# Patient Record
Sex: Male | Born: 1970 | Race: Black or African American | Hispanic: No | Marital: Single | Smoking: Current every day smoker
Health system: Southern US, Community
[De-identification: ages and names within clinical notes are randomized; demographics above are authoritative.]

---

## 2007-08-07 ENCOUNTER — Emergency Department (HOSPITAL_COMMUNITY): Admission: EM | Admit: 2007-08-07 | Discharge: 2007-08-07 | Payer: Self-pay | Admitting: Emergency Medicine

## 2011-05-28 ENCOUNTER — Emergency Department (HOSPITAL_COMMUNITY)
Admission: EM | Admit: 2011-05-28 | Discharge: 2011-05-29 | Disposition: A | Discharge status: Home / Self Care | Payer: BC Managed Care – PPO | Attending: Emergency Medicine | Admitting: Emergency Medicine

## 2011-05-28 DIAGNOSIS — R0789 Other chest pain: Secondary | ICD-10-CM

## 2011-05-28 DIAGNOSIS — R0602 Shortness of breath: Secondary | ICD-10-CM | POA: Insufficient documentation

## 2011-05-28 DIAGNOSIS — R079 Chest pain, unspecified: Secondary | ICD-10-CM | POA: Insufficient documentation

## 2011-05-28 DIAGNOSIS — J189 Pneumonia, unspecified organism: Secondary | ICD-10-CM

## 2011-05-28 DIAGNOSIS — R799 Abnormal finding of blood chemistry, unspecified: Secondary | ICD-10-CM | POA: Insufficient documentation

## 2011-05-28 DIAGNOSIS — R071 Chest pain on breathing: Secondary | ICD-10-CM | POA: Insufficient documentation

## 2011-05-29 ENCOUNTER — Emergency Department (HOSPITAL_COMMUNITY): Payer: BC Managed Care – PPO

## 2011-05-29 ENCOUNTER — Encounter (HOSPITAL_COMMUNITY): Payer: Self-pay | Admitting: Emergency Medicine

## 2011-05-29 ENCOUNTER — Other Ambulatory Visit: Payer: Self-pay

## 2011-05-29 LAB — CBC
Platelets: 219 10*3/uL (ref 150–400)
RBC: 4.85 MIL/uL (ref 4.22–5.81)
RDW: 13.8 % (ref 11.5–15.5)
WBC: 8.5 10*3/uL (ref 4.0–10.5)

## 2011-05-29 LAB — PROTIME-INR
INR: 0.97 (ref 0.00–1.49)
Prothrombin Time: 13.1 seconds (ref 11.6–15.2)

## 2011-05-29 LAB — DIFFERENTIAL
Basophils Absolute: 0.1 10*3/uL (ref 0.0–0.1)
Eosinophils Relative: 12 % — ABNORMAL HIGH (ref 0–5)
Lymphocytes Relative: 35 % (ref 12–46)
Lymphs Abs: 2.9 10*3/uL (ref 0.7–4.0)
Neutrophils Relative %: 43 % (ref 43–77)

## 2011-05-29 LAB — COMPREHENSIVE METABOLIC PANEL
ALT: 19 U/L (ref 0–53)
AST: 18 U/L (ref 0–37)
Alkaline Phosphatase: 79 U/L (ref 39–117)
CO2: 25 mEq/L (ref 19–32)
Calcium: 9.3 mg/dL (ref 8.4–10.5)
GFR calc Af Amer: >90 mL/min (ref >90)
GFR calc non Af Amer: >90 mL/min (ref >90)
Glucose, Bld: 108 mg/dL — ABNORMAL HIGH (ref 70–99)
Potassium: 3.4 mEq/L — ABNORMAL LOW (ref 3.5–5.1)
Sodium: 138 mEq/L (ref 135–145)
Total Protein: 7.3 g/dL (ref 6.0–8.3)

## 2011-05-29 LAB — POCT I-STAT TROPONIN I

## 2011-05-29 LAB — PRO B NATRIURETIC PEPTIDE: Pro B Natriuretic peptide (BNP): 8.5 pg/mL (ref 0–125)

## 2011-05-29 MED ORDER — KETOROLAC TROMETHAMINE 30 MG/ML IJ SOLN
30.0000 mg | Freq: Once | INTRAMUSCULAR | Status: AC
  Administered 2011-05-29: 30 mg via INTRAVENOUS
  Filled 2011-05-29: qty 1

## 2011-05-29 MED ORDER — MOXIFLOXACIN HCL 400 MG PO TABS: 400.0000 mg | ORAL_TABLET | Freq: Every day | ORAL | Status: AC

## 2011-05-29 MED ORDER — AZITHROMYCIN 250 MG PO TABS
500.0000 mg | ORAL_TABLET | Freq: Once | ORAL | Status: AC
  Administered 2011-05-29: 500 mg via ORAL
  Filled 2011-05-29: qty 2

## 2011-05-29 MED ORDER — IBUPROFEN 600 MG PO TABS: 600.0000 mg | ORAL_TABLET | Freq: Four times a day (QID) | ORAL | Status: AC | PRN

## 2011-05-29 MED ORDER — CYCLOBENZAPRINE HCL 10 MG PO TABS
10.0000 mg | ORAL_TABLET | Freq: Three times a day (TID) | ORAL | Status: DC | PRN
  Administered 2011-05-29: 10 mg via ORAL
  Filled 2011-05-29: qty 1

## 2011-05-29 MED ORDER — IOHEXOL 300 MG/ML  SOLN
100.0000 mL | Freq: Once | INTRAMUSCULAR | Status: AC | PRN
  Administered 2011-05-29: 100 mL via INTRAVENOUS

## 2011-05-29 MED ORDER — CYCLOBENZAPRINE HCL 10 MG PO TABS: 10.0000 mg | ORAL_TABLET | Freq: Three times a day (TID) | ORAL | Status: AC | PRN

## 2011-05-29 MED ORDER — SODIUM CHLORIDE 0.9 % IV SOLN
INTRAVENOUS | Status: DC
  Administered 2011-05-29: 02:00:00 via INTRAVENOUS

## 2011-05-29 MED ORDER — DEXTROSE 5 % IV SOLN
1.0000 g | Freq: Once | INTRAVENOUS | Status: AC
  Administered 2011-05-29: 1 g via INTRAVENOUS
  Filled 2011-05-29: qty 10

## 2011-05-29 MED ORDER — DM-GUAIFENESIN ER 30-600 MG PO TB12: 1.0000 | ORAL_TABLET | Freq: Two times a day (BID) | ORAL | Status: AC

## 2011-05-29 NOTE — Discharge Instructions (Signed)
You have a pneumonia on the CT scan, your pneumonia wasn't visible on your chest xray. Your blood tests do not show evidence of a heart attack or fluid in your lungs. Take the antibiotic until gone. You got the first dose today in the ED, you can start the prescription antibiotic tomorrow. I also gave you medication for your cough and also for your chest wall pain from the pneumonia. You should be rechecked in about 2 days, you can go to the [facility] for that. If you feel worse, such as feeling like you are struggling to breathe, you should come back to be rechecked.

## 2011-05-29 NOTE — ED Notes (Signed)
Pt c/o of intermitted chest pain for three days. Pain is worse when pt takes a deep breath

## 2011-05-29 NOTE — ED Notes (Signed)
MD at bedside. 

## 2011-05-29 NOTE — ED Provider Notes (Cosign Needed)
History     CSN: [id number]  Arrival date & time 05/28/11  2329   First MD Initiated Contact with Patient 05/28/11 2351      Chief Complaint  Patient presents with  . Shortness of Breath    pain with deep Breaths  . Chest Pain    (Consider location/radiation/quality/duration/timing/severity/associated sxs/prior treatment) HPI  Patient relates about 3 days ago while driving he started getting a left sided chest pain that was intermittent for hours but has been constant now since last night. He indicates that it is in his left lateral chest and swings around into the left mid axilla. He states the pain is sharp and burning in character. He states it hurts with deep breathing and he also feels short of breath. He denies cough, fever, nausea, vomiting. He states he had rhinorrhea earlier in the week without fever. He states he's never had this pain before. He denies any change in activity. He denies any prolonged sitting or driving.  PCP none  History reviewed. No pertinent past medical history.  History reviewed. No pertinent past surgical history.  Family History  Problem Relation Age of Onset  . Diabetes Mother   No CAD  History  Substance Use Topics  . Smoking status: Current Everyday Smoker -- 7 years    Types: Cigars  . Smokeless tobacco: Never Used  . 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    Allergies  Shellfish allergy  Home Medications  none   BP 148/84  Pulse 68  Temp(Src) 98.4 F (36.9 C) (Oral)  Resp 18  Ht 6' (1.829 m)  Wt 205 lb (92.987 kg)  BMI 27.80 kg/m2  SpO2 100%  Vital signs normal    Physical Exam  Nursing note and vitals reviewed. Constitutional: He is oriented to person, place, and time. He appears well-developed and well-nourished.  Non-toxic appearance. He does not appear ill. No distress.  HENT:  Head: Normocephalic and atraumatic.  Right Ear: External ear  normal.  Left Ear: External ear normal.  Nose: Nose normal. No mucosal edema or rhinorrhea.  Mouth/Throat: Oropharynx is clear and moist and mucous membranes are normal. No dental abscesses or uvula swelling.  Eyes: Conjunctivae and EOM are normal. Pupils are equal, round, and reactive to light.  Neck: Normal range of motion and full passive range of motion without pain. Neck supple.  Cardiovascular: Normal rate, regular rhythm and normal heart sounds.  Exam reveals no gallop and no friction rub.   No murmur heard. Pulmonary/Chest: No respiratory distress. He has no wheezes. He has no rhonchi. He has no rales. He exhibits tenderness. He exhibits no crepitus.         Patient has mild tenderness in his left chest however he states he has more pain that appears to be deeper than I can touch. He also is unable to take deep breaths because of pain. He also seems painful when he tried to change positions. Patient's noted to take shallow breaths  Abdominal: Soft. Normal appearance and bowel sounds are normal. He exhibits no distension. There is no tenderness. There is no rebound and no guarding.  Musculoskeletal: Normal range of motion. He exhibits no edema and no tenderness.       Moves all extremities well.   Neurological: He is alert and oriented to person, place, and time. He has normal strength. No cranial nerve deficit.  Skin: Skin is  warm, dry and intact. No rash noted. No erythema. No pallor.  Psychiatric: His speech is normal and behavior is normal. His mood appears not anxious.       Very flat affect    ED Course  Procedures (including critical care time)  Patient was treated for what was felt to be chest wall pain. His d-dimer came back elevated and a CT imaging of the chest was done to rule out PE at which point he had onsolidation in his left lower lung and a little bit in the right  that the radiologist felt was edema and not an infiltrate such as pneumonia. A BNP was added to his labs  and came back nonsignificant. Patient was treated for his pneumonia.    patient was ambulated nursing staff and his pulse ox remained 98%. It was felt he could be discharged.   Medications  0.9 %  sodium chloride infusion (  Intravenous New Bag/Given 05/29/11 0141)  cyclobenzaprine (FLEXERIL) tablet 10 mg (10 mg Oral Given 05/29/11 0132)  ketorolac (TORADOL) 30 MG/ML injection 30 mg (30 mg Intravenous Given 05/29/11 0141)  iohexol (OMNIPAQUE) 300 MG/ML solution 100 mL (100 mL Intravenous Contrast Given 05/29/11 0307)  cefTRIAXone (ROCEPHIN) 1 g in dextrose 5 % 50 mL IVPB (1 g Intravenous Given 05/29/11 0645)  azithromycin (ZITHROMAX) tablet 500 mg (500 mg Oral Given 05/29/11 0644)   Pt states his pain is better.    Results for orders placed during the hospital encounter of 05/28/11  CBC      Component Value Range   WBC 8.5  4.0 - 10.5 (K/uL)   RBC 4.85  4.22 - 5.81 (MIL/uL)   Hemoglobin 14.4  13.0 - 17.0 (g/dL)   HCT 11.9  14.7 - 82.9 (%)   MCV 85.6  78.0 - 100.0 (fL)   MCH 29.7  26.0 - 34.0 (pg)   MCHC 34.7  30.0 - 36.0 (g/dL)   RDW 56.2  13.0 - 86.5 (%)   Platelets 219  150 - 400 (K/uL)  DIFFERENTIAL      Component Value Range   Neutrophils Relative 43  43 - 77 (%)   Neutro Abs 3.7  1.7 - 7.7 (K/uL)   Lymphocytes Relative 35  12 - 46 (%)   Lymphs Abs 2.9  0.7 - 4.0 (K/uL)   Monocytes Relative 10  3 - 12 (%)   Monocytes Absolute 0.8  0.1 - 1.0 (K/uL)   Eosinophils Relative 12 (*) 0 - 5 (%)   Eosinophils Absolute 1.0 (*) 0.0 - 0.7 (K/uL)   Basophils Relative 1  0 - 1 (%)   Basophils Absolute 0.1  0.0 - 0.1 (K/uL)  COMPREHENSIVE METABOLIC PANEL      Component Value Range   Sodium 138  135 - 145 (mEq/L)   Potassium 3.4 (*) 3.5 - 5.1 (mEq/L)   Chloride 104  96 - 112 (mEq/L)   CO2 25  19 - 32 (mEq/L)   Glucose, Bld 108 (*) 70 - 99 (mg/dL)   BUN 10  6 - 23 (mg/dL)   Creatinine, Ser 7.84  0.50 - 1.35 (mg/dL)   Calcium 9.3  8.4 - 69.6 (mg/dL)   Total Protein 7.3  6.0 - 8.3 (g/dL)    Albumin 3.7  3.5 - 5.2 (g/dL)   AST 18  0 - 37 (U/L)   ALT 19  0 - 53 (U/L)   Alkaline Phosphatase 79  39 - 117 (U/L)   Total Bilirubin 0.2 (*) 0.3 -  1.2 (mg/dL)   GFR calc non Af Amer >90  >90 (mL/min)   GFR calc Af Amer >90  >90 (mL/min)  D-DIMER, QUANTITATIVE      Component Value Range   D-Dimer, Quant 3.60 (*) 0.00 - 0.48 (ug/mL-FEU)  APTT      Component Value Range   aPTT 39 (*) 24 - 37 (seconds)  PROTIME-INR      Component Value Range   Prothrombin Time 13.1  11.6 - 15.2 (seconds)   INR 0.97  0.00 - 1.49   POCT I-STAT TROPONIN I      Component Value Range   Troponin i, poc 0.00  0.00 - 0.08 (ng/mL)   Comment 3           PRO B NATRIURETIC PEPTIDE      Component Value Range   Pro B Natriuretic peptide (BNP) 8.5  0 - 125 (pg/mL)   Laboratory interpretation all normal except elevated d-dimer   Dg Chest 2 View  05/29/2011  *RADIOLOGY REPORT*  Clinical Data: Left-sided chest pain and tightness; shortness of breath for 3 days.  History of smoking.  CHEST - 2 VIEW  Comparison: None.  Findings: The lungs are well-aerated.  Likely chronic peribronchial thickening is noted.  Blunting of the left costophrenic angle likely reflects a small left pleural effusion, though pleural thickening could have a similar appearance.  Mild left basilar airspace opacity likely reflects atelectasis.  There is no evidence of pneumothorax.  The heart is normal in size; the mediastinal contour is within normal limits.  No acute osseous abnormalities are seen.  IMPRESSION: Likely chronic peribronchial thickening and suspected small left pleural effusion.  Mild left basilar opacity likely reflects atelectasis.  Original Report Authenticated By: Tonia Ghent, M.D.   Ct Angio Chest W/cm &/or Wo Cm  05/29/2011  *RADIOLOGY REPORT*  Clinical Data: Pleuritic left-sided chest pain and shortness of breath; elevated D-dimer.  CT ANGIOGRAPHY CHEST  Technique:  Multidetector CT imaging of the chest using the standard  protocol during bolus administration of intravenous contrast. Multiplanar reconstructed images including MIPs were obtained and reviewed to evaluate the vascular anatomy.  Contrast: OMNIPAQUE IOHEXOL 300 MG/ML IJ SOLN  Comparison: Chest radiograph performed earlier today at 01:19 a.m.  Findings: There is no evidence of pulmonary embolus.  Patchy airspace opacification is noted at the left lung base; mild ground-glass opacity is suggested within the right lower lobe.  A small left pleural effusion is seen.  Findings raise suspicion for asymmetric pulmonary edema, left greater than right.  Scattered tiny nodular densities are seen within both lungs, measuring less than 4 mm in size.  These are likely post infectious in nature, given the patient's age and the multiplicity of nodules. Minimal blebs are seen at the lung apices.  There is no evidence of pneumothorax.  No abnormal focal contrast enhancement is seen.  The mediastinum is unremarkable in appearance.  No mediastinal lymphadenopathy is seen.  No pericardial effusion is identified. The great vessels are unremarkable in appearance.  No axillary lymphadenopathy is seen.  The thyroid gland is unremarkable in appearance.  A small 1.0 cm hypodensity is noted within the liver, likely reflecting a small cyst; the visualized portions of the liver and spleen are otherwise unremarkable.  No acute osseous abnormalities are seen.  IMPRESSION:  1.  No evidence of pulmonary embolus. 2.  Patchy airspace opacification at the left lung base; mild ground-glass opacity within the right lower lobe.  Small left pleural effusion seen.  Findings raise suspicion for asymmetric pulmonary edema, left greater than right.  The appearance is atypical for pneumonia. 3.  Scattered tiny nodular density within both lungs are most likely post infectious in nature. 4.  Minimal blebs at the lung apices. 5.  Likely small hepatic cyst.  Original Report Authenticated By: Tonia Ghent, M.D.        Date: 05/29/2011  Rate: 73  Rhythm: normal sinus rhythm  QRS Axis: normal  Intervals: normal  ST/T Wave abnormalities: normal  Conduction Disutrbances:LVH  Narrative Interpretation:   Old EKG Reviewed: none available    1. Left-sided chest wall pain   2. Community acquired pneumonia    New Prescriptions   CYCLOBENZAPRINE (FLEXERIL) 10 MG TABLET    Take 1 tablet (10 mg total) by mouth 3 (three) times daily as needed for muscle spasms.   DEXTROMETHORPHAN-GUAIFENESIN (MUCINEX DM) 30-600 MG PER 12 HR TABLET    Take 1 tablet by mouth every 12 (twelve) hours.   IBUPROFEN (ADVIL,MOTRIN) 600 MG TABLET    Take 1 tablet (600 mg total) by mouth every 6 (six) hours as needed for pain.   MOXIFLOXACIN (AVELOX) 400 MG TABLET    Take 1 tablet (400 mg total) by mouth daily.   Plan discharge  Devoria Albe, MD, FACEP    MDM          Ward Givens, MD 05/29/11 [phone]

## 2011-05-29 NOTE — ED Notes (Signed)
Patient transported to X-ray and returned 

## 2011-05-29 NOTE — ED Notes (Signed)
Patient transported to CT 

## 2013-09-16 IMAGING — CT CT ANGIO CHEST
1 of 2 series · 19 of 32 positions shown · IV contrast (APPLIED)
Comparison: Chest radiograph performed earlier today at [DATE] a.m.

CLINICAL DATA: Pleuritic left-sided chest pain and shortness of
breath; elevated D-dimer.

CT ANGIOGRAPHY CHEST
TECHNIQUE: Multidetector CT imaging of the chest using the
standard protocol during bolus administration of intravenous
contrast. Multiplanar reconstructed images including MIPs were
obtained and reviewed to evaluate the vascular anatomy.
Contrast: 100mL OMNIPAQUE IOHEXOL 300 MG/ML IJ SOLN

[Series 5: thins for pacs · axial · 0.68mm/px · z∈[-295,-45]mm · 19 of 274 slices shown]
[im 12/274  lung]
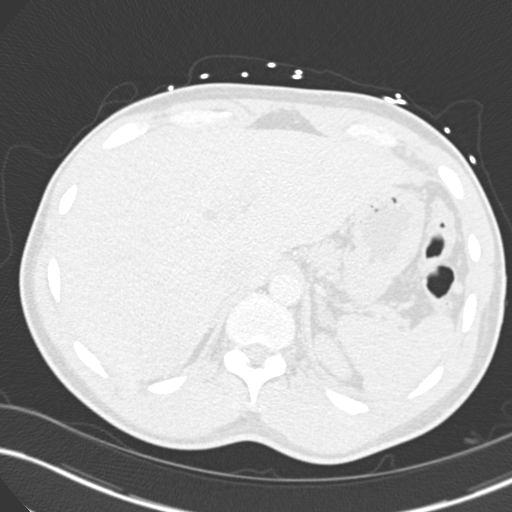
[im 24/274  soft-tissue]
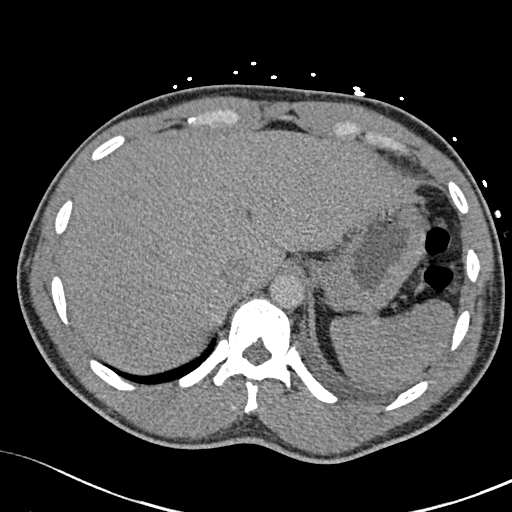
[im 36/274  lung]
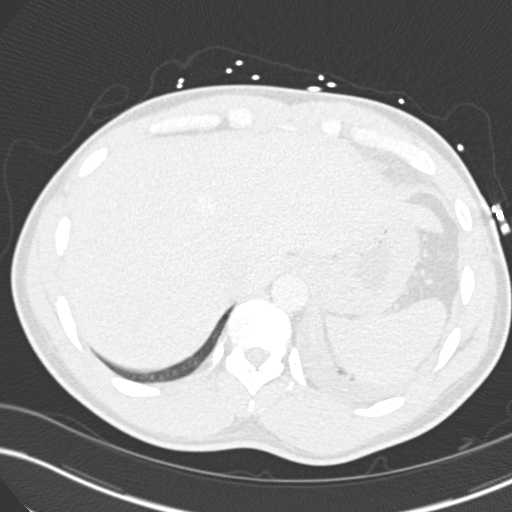
[im 60/274  soft-tissue]
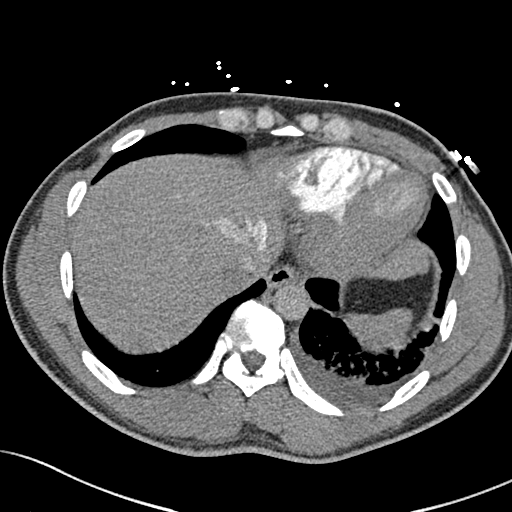
[im 72/274  lung]
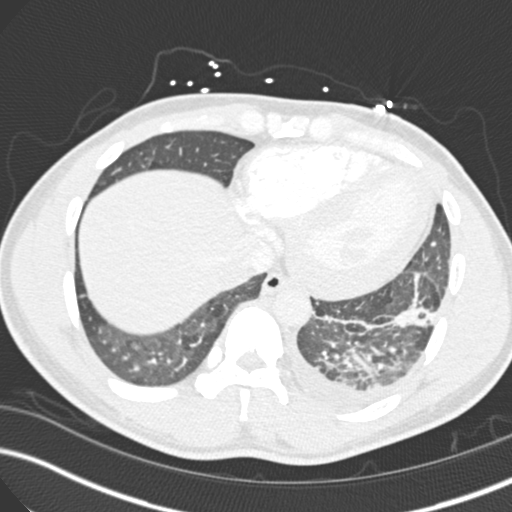
[im 84/274  soft-tissue]
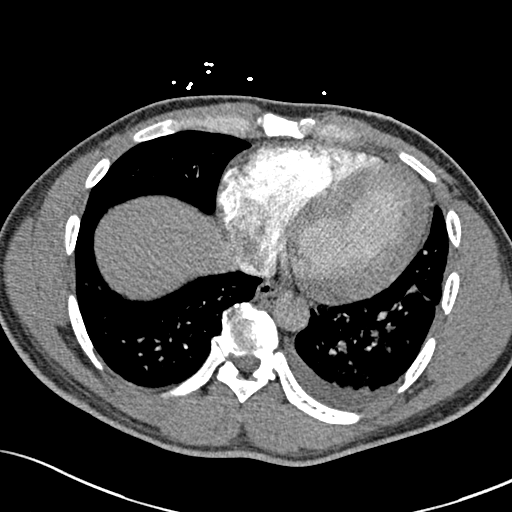
[im 95/274  lung]
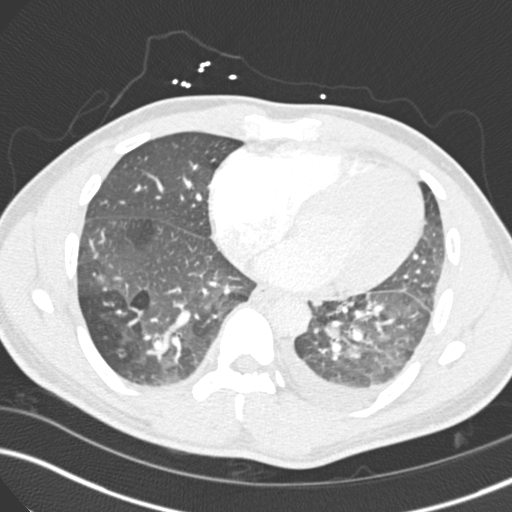
[im 107/274  soft-tissue]
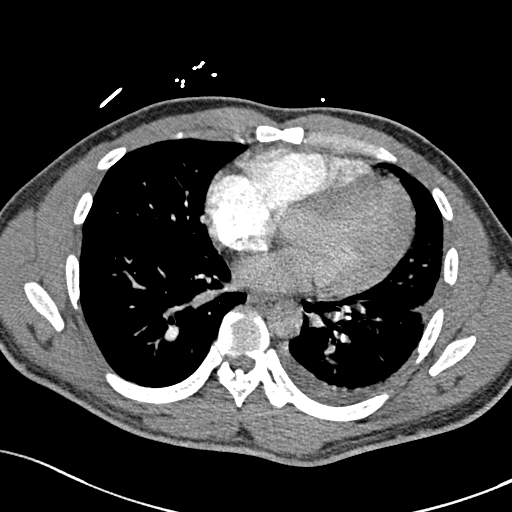
[im 119/274  lung]
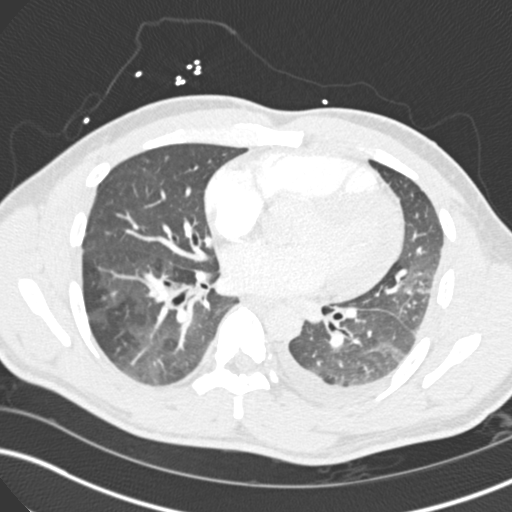
[im 143/274  soft-tissue]
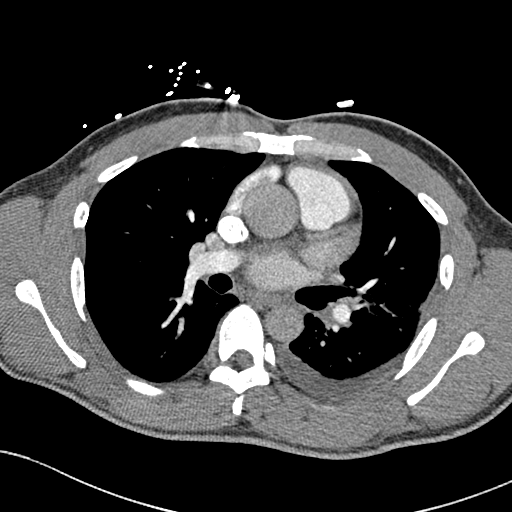
[im 155/274  lung]
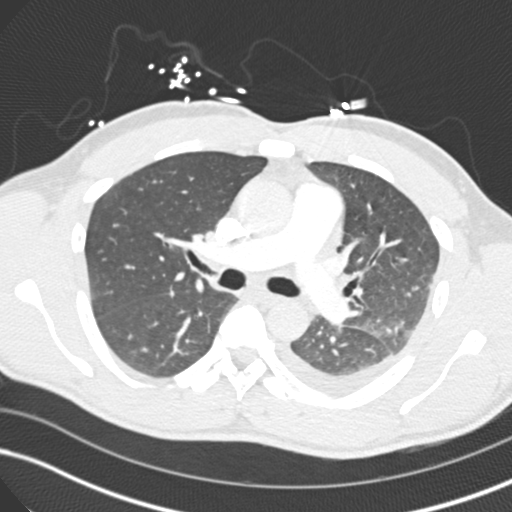
[im 167/274  soft-tissue]
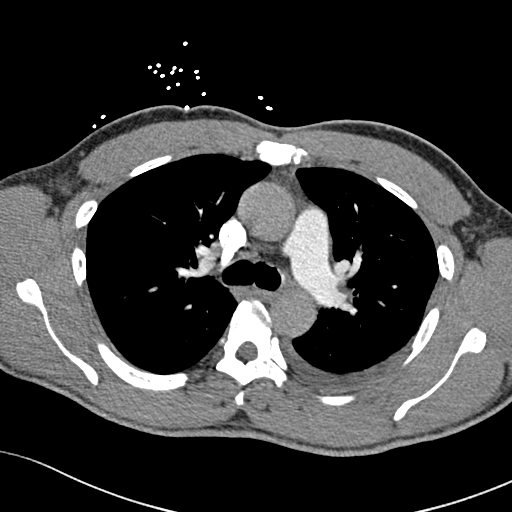
[im 179/274  lung]
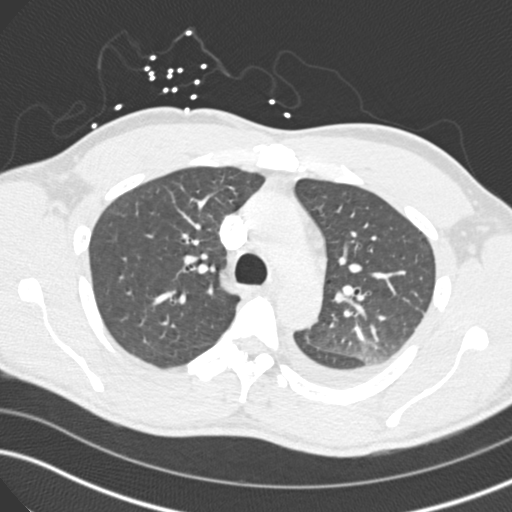
[im 190/274  soft-tissue]
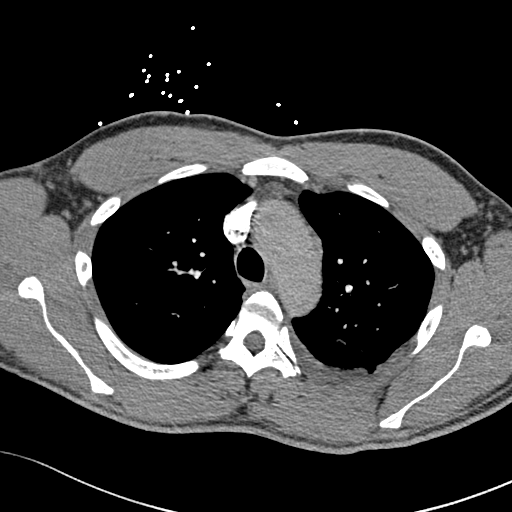
[im 202/274  lung]
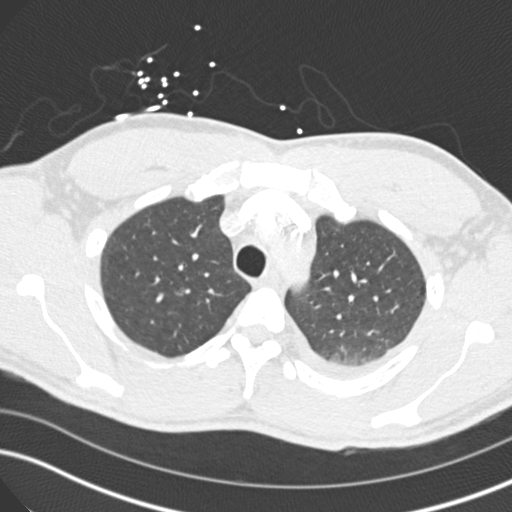
[im 214/274  soft-tissue]
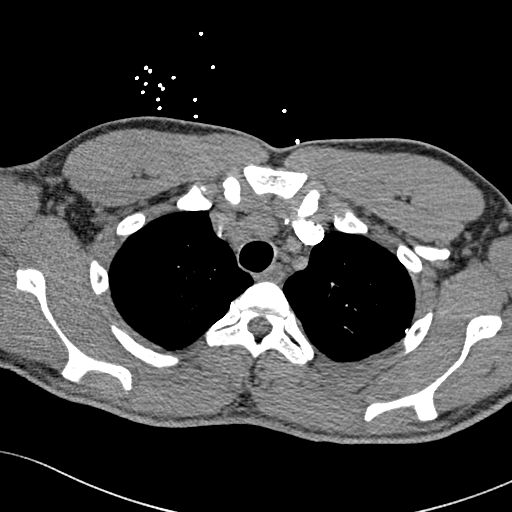
[im 238/274  lung]
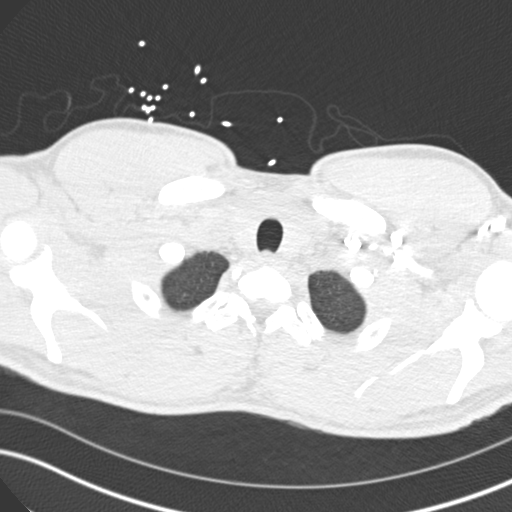
[im 250/274  soft-tissue]
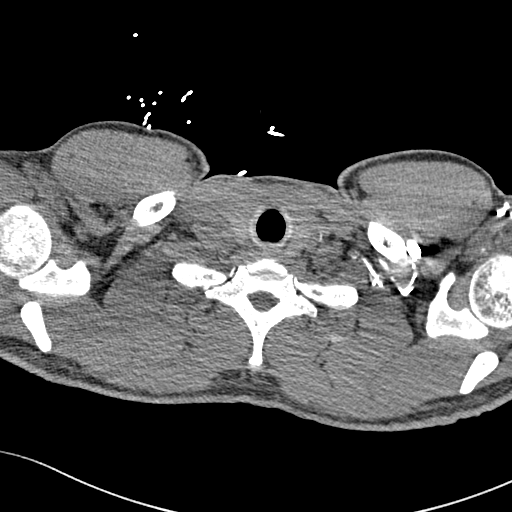
[im 262/274  lung]
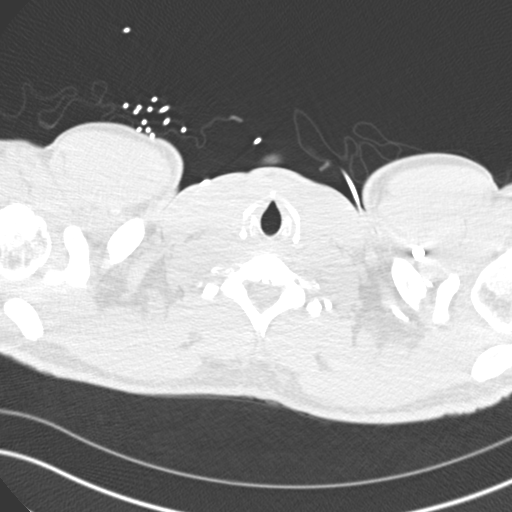

[19 of 32 positions shown; findings below may reference images not displayed]

FINDINGS: There is no evidence of pulmonary embolus.

Patchy airspace opacification is noted at the left lung base; mild
ground-glass opacity is suggested within the right lower lobe.  A
small left pleural effusion is seen.  Findings raise suspicion for
asymmetric pulmonary edema, left greater than right.

Scattered tiny nodular densities are seen within both lungs,
measuring less than 4 mm in size.  These are likely post infectious
in nature, given the patient's age and the multiplicity of nodules.
Minimal blebs are seen at the lung apices.

There is no evidence of pneumothorax.  No abnormal focal contrast
enhancement is seen.

The mediastinum is unremarkable in appearance.  No mediastinal
lymphadenopathy is seen.  No pericardial effusion is identified.
The great vessels are unremarkable in appearance.  No axillary
lymphadenopathy is seen.  The thyroid gland is unremarkable in
appearance.

A small 1.0 cm hypodensity is noted within the liver, likely
reflecting a small cyst; the visualized portions of the liver and
spleen are otherwise unremarkable.  No acute osseous abnormalities
are seen.
IMPRESSION: 1.  No evidence of pulmonary embolus.
2.  Patchy airspace opacification at the left lung base; mild
ground-glass opacity within the right lower lobe.  Small left
pleural effusion seen.  Findings raise suspicion for asymmetric
pulmonary edema, left greater than right.  The appearance is
atypical for pneumonia.
3.  Scattered tiny nodular density within both lungs are most
likely post infectious in nature.
4.  Minimal blebs at the lung apices.
5.  Likely small hepatic cyst.

## 2015-11-26 ENCOUNTER — Encounter (HOSPITAL_COMMUNITY): Payer: Self-pay | Admitting: Emergency Medicine

## 2015-11-26 ENCOUNTER — Ambulatory Visit (HOSPITAL_COMMUNITY)
Admission: EM | Admit: 2015-11-26 | Discharge: 2015-11-26 | Disposition: A | Discharge status: Home / Self Care | Payer: BLUE CROSS/BLUE SHIELD | Attending: Family | Admitting: Family

## 2015-11-26 DIAGNOSIS — H109 Unspecified conjunctivitis: Secondary | ICD-10-CM

## 2015-11-26 DIAGNOSIS — L739 Follicular disorder, unspecified: Secondary | ICD-10-CM

## 2015-11-26 MED ORDER — GENTAMICIN SULFATE 0.3 % OP SOLN: 2.0000 [drp] | OPHTHALMIC | 0 refills | Status: AC

## 2015-11-26 MED ORDER — CEPHALEXIN 500 MG PO CAPS: 500.0000 mg | ORAL_CAPSULE | Freq: Three times a day (TID) | ORAL | 0 refills | Status: AC

## 2015-11-26 NOTE — ED Provider Notes (Signed)
CSN: [id number]     Arrival date & time 11/26/15  1243 History   First MD Initiated Contact with Patient 11/26/15 1324     Chief Complaint  Patient presents with  . Eye Problem   (Consider location/radiation/quality/duration/timing/severity/associated sxs/prior Treatment) 45 year old male presents with right eye irritation and discharge that started yesterday. Has had cold symptoms for over 1 week that are resolving but now having right eye issues. No other family members ill. Has tried using artificial tears with some relief. Took out his contacts last night- wearing glasses today.  Also has "bumps" on the back of his scalp for a few weeks. Initially bumped his head at work and now finding more bumps appearing in the past week. Slightly painful. No itching or discharge. Has tried Hydrocortisone cream on areas with no relief.       History reviewed. No pertinent past medical history. History reviewed. No pertinent surgical history. Family History  Problem Relation Age of Onset  . Diabetes Mother    Social History  Substance Use Topics  . Smoking status: Current Every Day Smoker    Years: 7.00    Types: Cigars  . Smokeless tobacco: Never Used  . Alcohol use No    Review of Systems  Constitutional: Positive for fatigue. Negative for chills and fever.  HENT: Positive for congestion (resolving).   Eyes: Positive for photophobia, discharge, redness and itching. Negative for pain.  Respiratory: Negative for cough and shortness of breath.   Cardiovascular: Negative for chest pain.  Skin: Positive for rash.  Neurological: Negative for dizziness, tremors, syncope, weakness, light-headedness, numbness and headaches.  Hematological: Negative.     Allergies  Shellfish allergy  Home Medications   Prior to Admission medications   Medication Sig Start Date End Date Taking? Authorizing Provider  cephALEXin (KEFLEX) 500 MG capsule Take 1 capsule (500 mg total) by mouth 3 (three) times  daily. 11/26/15 12/06/15  Sudie GrumblingAnn Berry Zinedine Ellner, NP  gentamicin (GARAMYCIN) 0.3 % ophthalmic solution Place 2 drops into the right eye every 4 (four) hours. 11/26/15 12/03/15  Sudie GrumblingAnn Berry Tnia Anglada, NP   Meds Ordered and Administered this Visit  Medications - No data to display  BP 136/75 (BP Location: Left Arm)   Pulse 98   Temp 97.8 F (36.6 C) (Oral)   Resp 18   SpO2 97%  No data found.   Physical Exam  Constitutional: He is oriented to person, place, and time. He appears well-developed and well-nourished. No distress.  HENT:  Head: Normocephalic. Hair is abnormal.    Right Ear: Hearing, tympanic membrane, external ear and ear canal normal.  Left Ear: Hearing, tympanic membrane, external ear and ear canal normal.  Nose: Nose normal.  Mouth/Throat: Uvula is midline, oropharynx is clear and moist and mucous membranes are normal.  3 round to oval nodules of varying sizes present on occipital area. Flesh to red-colored,soft with some tenderness. No discharge or drainage. No hair growth in area of nodules.   Eyes: EOM and lids are normal. Pupils are equal, round, and reactive to light. Right eye exhibits discharge (slight yellow crusting present on lids). No foreign body present in the right eye. Left eye exhibits no discharge. No foreign body present in the left eye. Right conjunctiva is injected. Left conjunctiva has no hemorrhage.  Neck: Normal range of motion. Neck supple.  Cardiovascular: Normal rate, regular rhythm and normal heart sounds.   Pulmonary/Chest: Effort normal and breath sounds normal.  Lymphadenopathy:    He  has no cervical adenopathy.  Neurological: He is alert and oriented to person, place, and time.  Skin: Skin is warm and dry. Capillary refill takes less than 2 seconds. Rash noted. Rash is papular and nodular.  Psychiatric: He has a normal mood and affect. His behavior is normal. Judgment and thought content normal.    Urgent Care Course   Clinical Course    Procedures  (including critical care time)  Labs Review Labs Reviewed - No data to display  Imaging Review No results found.   Visual Acuity Review  Right Eye Distance: (S) unable to obtain Left Eye Distance: (S) 20/40 w/glasses Bilateral Distance:    Right Eye Near:   Left Eye Near:    Bilateral Near:         MDM   1. Conjunctivitis of right eye   2. Folliculitis    Discussed that he most likely has "pink eye" which is contagious. Recommend start Gentamicin drops as directed. Do not wear contacts for 7 days.  The lesions on his scalp appear follicular and not fungal. Recommend start Keflex 500mg  3 times a day. May need to see a Dermatologist if lesions do not resolve. Wash hair with normal shampoo. Do not shave hair on head.  Recommend follow-up with his primary care provider or an eye doctor if eye symptoms do not improve within 2- 3 days.     Sudie Grumbling, NP 11/27/15 [phone]

## 2015-11-26 NOTE — ED Triage Notes (Signed)
Pt c/o right eye redness onset yest associated w/pain, BV, and watery  Denies inj/trauma... Wears eye contacts for the most part but has glasses on today  A&O x4... NAD

## 2015-11-26 NOTE — Discharge Instructions (Signed)
Start Gentamycin eye drops every 4 hours in right eye as directed. Do not wear contacts for 7 days. Start Keflex 500mg  as directed. Follow-up with a primary care provider in 2 to 3 days if eye symptoms are not improving.

## 2018-03-31 ENCOUNTER — Encounter (HOSPITAL_COMMUNITY): Payer: Self-pay | Admitting: *Deleted

## 2018-03-31 ENCOUNTER — Other Ambulatory Visit: Payer: Self-pay

## 2018-03-31 ENCOUNTER — Ambulatory Visit (HOSPITAL_COMMUNITY)
Admission: EM | Admit: 2018-03-31 | Discharge: 2018-03-31 | Disposition: A | Discharge status: Home / Self Care | Payer: BLUE CROSS/BLUE SHIELD | Attending: Family Medicine | Admitting: Family Medicine

## 2018-03-31 DIAGNOSIS — H1031 Unspecified acute conjunctivitis, right eye: Secondary | ICD-10-CM | POA: Insufficient documentation

## 2018-03-31 MED ORDER — TETRACAINE HCL 0.5 % OP SOLN
OPHTHALMIC | Status: AC
  Filled 2018-03-31: qty 4

## 2018-03-31 MED ORDER — OFLOXACIN 0.3 % OP SOLN: OPHTHALMIC | 0 refills | Status: AC

## 2018-03-31 NOTE — Discharge Instructions (Signed)
Use ofloxacin eyedrops as directed on right eye. No contact lens use until symptoms resolve. Please discard current pair of contacts. Artificial tear gel (over the counter, systane/genteal) at night. Wait 10-15 minutes between drops, always use artificial tear gel last, as it prevents drops from penetrating through. Lid scrubs and warm compresses as directed. Monitor for any worsening of symptoms, changes in vision, sensitivity to light, eye swelling, painful eye movement, follow up with ophthalmology for further evaluation.

## 2018-03-31 NOTE — ED Triage Notes (Signed)
C/o right eye red and draining onset 3 days ago.

## 2018-03-31 NOTE — ED Provider Notes (Signed)
[facility]    CSN: [id number] Arrival date & time: 03/31/18  1154     History   Chief Complaint Chief Complaint  Patient presents with  . Eye Problem    HPI Bryan Gross is a 48 y.o. male.   48 year old male comes in for 3-day history of right eyelid swelling and right eye redness.  States has had draining to the nasal aspect of the eye.  Eye crusting in the morning.  Generalized blurry vision without photophobia.  He wears contacts.  Context of biweekly's, currently contact 98 days old.  He does sleep with contacts and intermittently.  Had URI symptoms prior to current symptom onset, but states URI symptoms has completely resolved.  Denies fever, chills, night sweats.  Denies painful eye movement.     History reviewed. No pertinent past medical history.  There are no active problems to display for this patient.   History reviewed. No pertinent surgical history.     Home Medications    Prior to Admission medications   Medication Sig Start Date End Date Taking? Authorizing Provider  ofloxacin (OCUFLOX) 0.3 % ophthalmic solution 1 drop in right eye every 2 hours for the first 2 days, then 4 times a day for the next 5 days. 03/31/18   Belinda Fisher, PA-C    Family History Family History  Problem Relation Age of Onset  . Diabetes Mother     Social History Social History   Tobacco Use  . Smoking status: Current Every Day Smoker    Years: 7.00    Types: Cigars  . Smokeless tobacco: Never Used  Substance Use Topics  . Alcohol use: No  . Drug use: No     Allergies   Shellfish allergy   Review of Systems Review of Systems  Reason unable to perform ROS: See HPI as above.     Physical Exam Triage Vital Signs ED Triage Vitals  Enc Vitals Group     BP 03/31/18 1205 (!) 147/85     Pulse Rate 03/31/18 1205 86     Resp 03/31/18 1205 16     Temp 03/31/18 1205 97.9 F (36.6 C)     Temp Source 03/31/18 1205 Oral     SpO2 03/31/18 1205 93 %     Weight  --      Height --      Head Circumference --      Peak Flow --      Pain Score 03/31/18 1207 0     Pain Loc --      Pain Edu? --      Excl. in GC? --    No data found.  Updated Vital Signs BP (!) 147/85 (BP Location: Right Arm)   Pulse 86   Temp 97.9 F (36.6 C) (Oral)   Resp 16   SpO2 93%   Visual Acuity Right Eye Distance:   20/40 (CC) Left Eye Distance:   20/20 (CC) Bilateral Distance:    Right Eye Near: R Near: 20/40 Left Eye Near:  L Near: 20/25 Bilateral Near:  20/25  Physical Exam Constitutional:      General: He is not in acute distress.    Appearance: He is well-developed. He is not ill-appearing, toxic-appearing or diaphoretic.  HENT:     Head: Normocephalic and atraumatic.  Eyes:     General: Lids are everted, no foreign bodies appreciated.     Extraocular Movements: Extraocular movements intact.     Conjunctiva/sclera:  Right eye: Right conjunctiva is injected.     Left eye: Left conjunctiva is not injected.     Pupils: Pupils are equal, round, and reactive to light.     Comments: See picture below.  Right upper eyelid swelling with erythema.  No tenderness to palpation.  Question folliculitis/hordeolum to the right upper nasal aspect of eyelid.  Contacts removed prior to fluorescein stain. Fluorescein stain without uptake.   Neck:     Musculoskeletal: Normal range of motion and neck supple.  Skin:    General: Skin is warm and dry.  Neurological:     Mental Status: He is alert and oriented to person, place, and time.          UC Treatments / Results  Labs (all labs ordered are listed, but only abnormal results are displayed) Labs Reviewed - No data to display  EKG None  Radiology No results found.  Procedures Procedures (including critical care time)  Medications Ordered in UC Medications - No data to display  Initial Impression / Assessment and Plan / UC Course  I have reviewed the triage vital signs and the nursing  notes.  Pertinent labs & imaging results that were available during my care of the patient were reviewed by me and considered in my medical decision making (see chart for details).    Given patient without glasses, driving himself, saline eye wash after fluorescein stain, so patient can apply contacts.  No tenderness to palpation of right upper eyelid, no painful eye movement, lower suspicion for periorbital cellulitis/orbital cellulitis.  Will treat for conjunctivitis, blepharitis/hordeolum.  Ofloxacin as directed.  Lid scrubs, warm compress.  Discussed with patient to remove contacts when getting home, and avoid contact lens use until current symptom resolved.  Return precautions given.  Patient expresses understanding and agrees to plan.  Case discussed with Dr Tracie HarrierHagler, who agrees to plan.  Final Clinical Impressions(s) / UC Diagnoses   Final diagnoses:  Acute conjunctivitis of right eye, unspecified acute conjunctivitis type    ED Prescriptions    Medication Sig Dispense Auth. Provider   ofloxacin (OCUFLOX) 0.3 % ophthalmic solution 1 drop in right eye every 2 hours for the first 2 days, then 4 times a day for the next 5 days. 5 mL Threasa AlphaYu, Azai Gaffin V, PA-C        Mikah Rottinghaus V, [place] 03/31/18 1322
